# Patient Record
Sex: Female | Born: 1970 | Race: White | Hispanic: No | Marital: Married | Smoking: Never smoker
Health system: Southern US, Community
[De-identification: ages and names within clinical notes are randomized; demographics above are authoritative.]

---

## 1997-09-22 ENCOUNTER — Inpatient Hospital Stay (HOSPITAL_COMMUNITY): Admission: AD | Admit: 1997-09-22 | Discharge: 1997-09-24 | Payer: Self-pay | Admitting: *Deleted

## 1997-10-29 ENCOUNTER — Other Ambulatory Visit: Admission: RE | Admit: 1997-10-29 | Discharge: 1997-10-29 | Payer: Self-pay | Admitting: *Deleted

## 1998-10-20 ENCOUNTER — Other Ambulatory Visit: Admission: RE | Admit: 1998-10-20 | Discharge: 1998-10-20 | Payer: Self-pay | Admitting: *Deleted

## 1999-07-11 ENCOUNTER — Inpatient Hospital Stay (HOSPITAL_COMMUNITY): Admission: AD | Admit: 1999-07-11 | Discharge: 1999-07-13 | Payer: Self-pay | Admitting: Obstetrics and Gynecology

## 1999-07-11 ENCOUNTER — Encounter (INDEPENDENT_AMBULATORY_CARE_PROVIDER_SITE_OTHER): Payer: Self-pay

## 1999-08-13 ENCOUNTER — Other Ambulatory Visit: Admission: RE | Admit: 1999-08-13 | Discharge: 1999-08-13 | Payer: Self-pay | Admitting: Obstetrics and Gynecology

## 2000-08-19 ENCOUNTER — Other Ambulatory Visit: Admission: RE | Admit: 2000-08-19 | Discharge: 2000-08-19 | Payer: Self-pay | Admitting: Obstetrics and Gynecology

## 2006-08-30 ENCOUNTER — Ambulatory Visit: Payer: Self-pay | Admitting: Gastroenterology

## 2006-08-30 LAB — CONVERTED CEMR LAB
ALT: 35 units/L (ref 0–35)
AST: 29 units/L (ref 0–37)
Albumin: 3.7 g/dL (ref 3.5–5.2)
Alkaline Phosphatase: 43 units/L (ref 39–117)
BUN: 20 mg/dL (ref 6–23)
Basophils Absolute: 0 10*3/uL (ref 0.0–0.1)
Basophils Relative: 0.3 % (ref 0.0–1.0)
Bilirubin, Direct: 0.1 mg/dL (ref 0.0–0.3)
CO2: 34 meq/L — ABNORMAL HIGH (ref 19–32)
Calcium: 9.2 mg/dL (ref 8.4–10.5)
Chloride: 108 meq/L (ref 96–112)
Creatinine, Ser: 0.6 mg/dL (ref 0.4–1.2)
Eosinophils Absolute: 0.2 10*3/uL (ref 0.0–0.6)
Eosinophils Relative: 4.4 % (ref 0.0–5.0)
GFR calc Af Amer: 145 mL/min
GFR calc non Af Amer: 120 mL/min
Glucose, Bld: 93 mg/dL (ref 70–99)
HCT: 38.5 % (ref 36.0–46.0)
Hemoglobin: 13.6 g/dL (ref 12.0–15.0)
Lymphocytes Relative: 24.3 % (ref 12.0–46.0)
MCHC: 35.2 g/dL (ref 30.0–36.0)
MCV: 93.5 fL (ref 78.0–100.0)
Monocytes Absolute: 0.6 10*3/uL (ref 0.2–0.7)
Monocytes Relative: 11.1 % — ABNORMAL HIGH (ref 3.0–11.0)
Neutro Abs: 3.1 10*3/uL (ref 1.4–7.7)
Neutrophils Relative %: 59.9 % (ref 43.0–77.0)
Platelets: 242 10*3/uL (ref 150–400)
Potassium: 4.5 meq/L (ref 3.5–5.1)
RBC: 4.12 M/uL (ref 3.87–5.11)
RDW: 12 % (ref 11.5–14.6)
Sodium: 146 meq/L — ABNORMAL HIGH (ref 135–145)
TSH: 1.32 microintl units/mL (ref 0.35–5.50)
Total Bilirubin: 0.7 mg/dL (ref 0.3–1.2)
Total Protein: 6 g/dL (ref 6.0–8.3)
WBC: 5.1 10*3/uL (ref 4.5–10.5)

## 2006-09-02 ENCOUNTER — Emergency Department (HOSPITAL_COMMUNITY): Admission: EM | Admit: 2006-09-02 | Discharge: 2006-09-03 | Payer: Self-pay | Admitting: Emergency Medicine

## 2006-09-09 ENCOUNTER — Ambulatory Visit: Payer: Self-pay | Admitting: Gastroenterology

## 2006-09-09 DIAGNOSIS — K6389 Other specified diseases of intestine: Secondary | ICD-10-CM | POA: Insufficient documentation

## 2007-04-22 DIAGNOSIS — K625 Hemorrhage of anus and rectum: Secondary | ICD-10-CM | POA: Insufficient documentation

## 2007-04-22 DIAGNOSIS — R1031 Right lower quadrant pain: Secondary | ICD-10-CM | POA: Insufficient documentation

## 2009-03-16 IMAGING — US US TRANSVAGINAL NON-OB
1 series · 14 of 25 positions shown · non-contrast
Comparison: none

CLINICAL DATA: None given. 
 ABDOMEN ULTRASOUND:
TECHNIQUE: Complete abdominal ultrasound examination was performed including evaluation of the liver, gallbladder, bile ducts, pancreas, kidneys, spleen, IVC, and abdominal aorta.
TECHNIQUE: Both transabdominal and transvaginal ultrasound examinations of the pelvis were performed including evaluation of the uterus, ovaries, adnexal regions, and pelvic cul-de-sac.

[Series 1: unknown · 0.32mm/px · 14 of 35 slices shown]
[im 1/35]
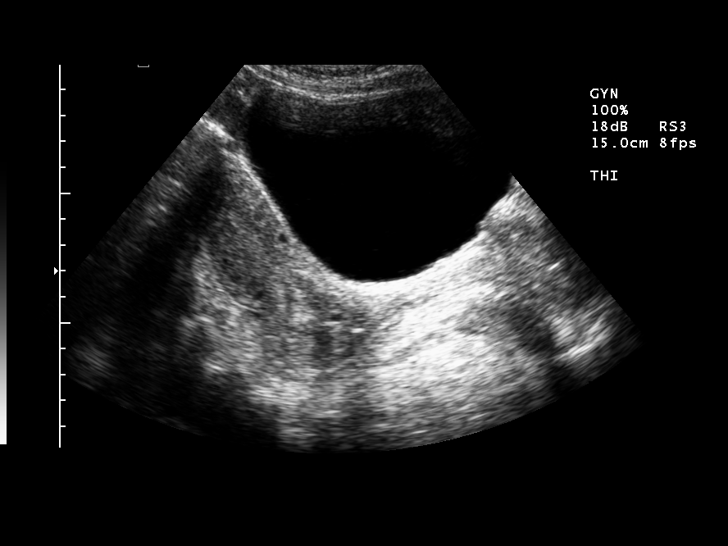
[im 3/35]
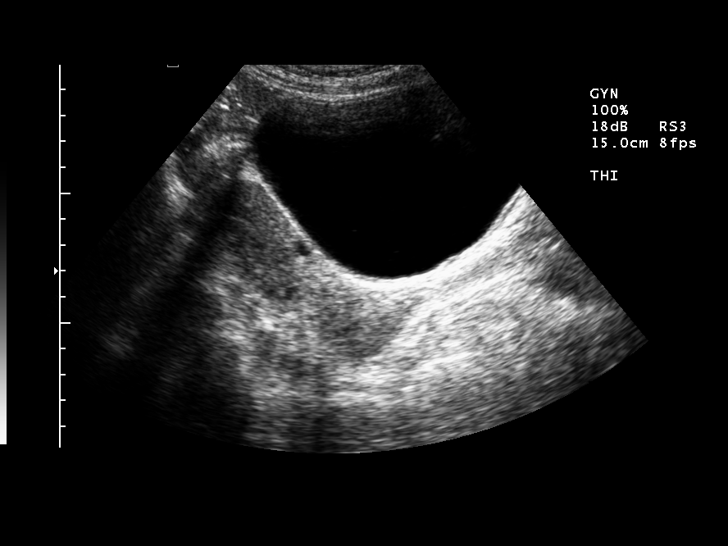
[im 6/35]
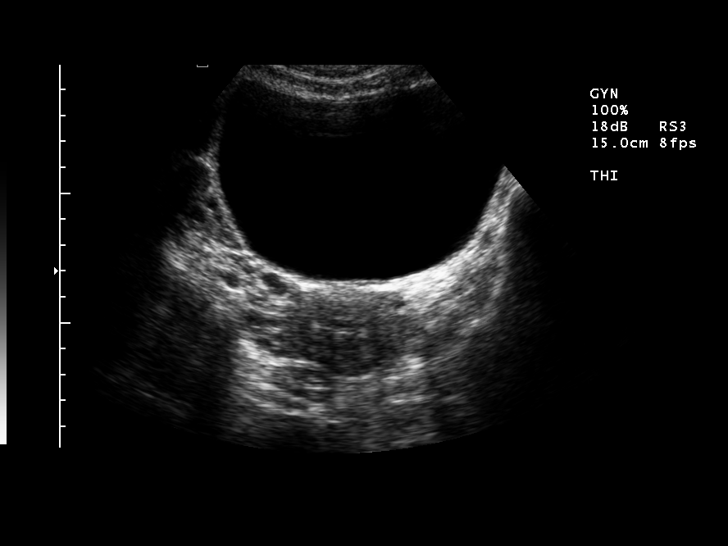
[im 9/35]
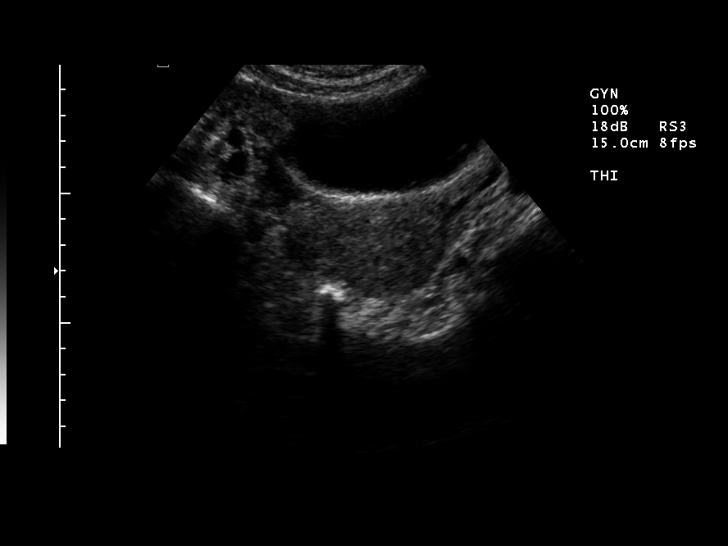
[im 12/35]
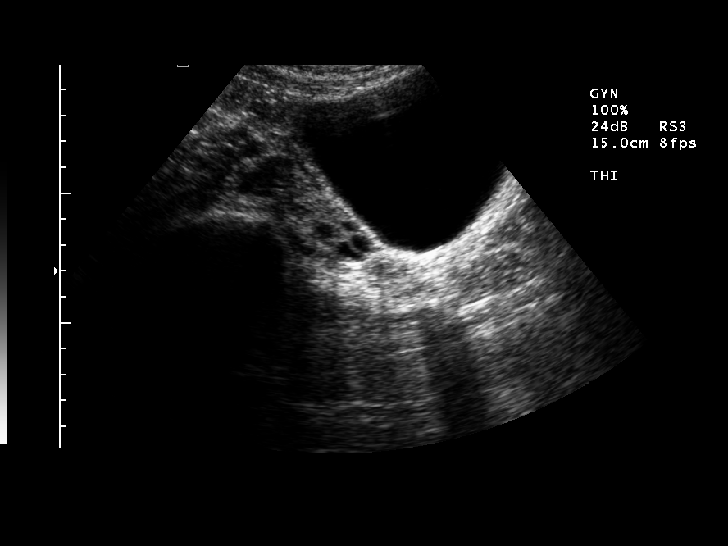
[im 13/35]
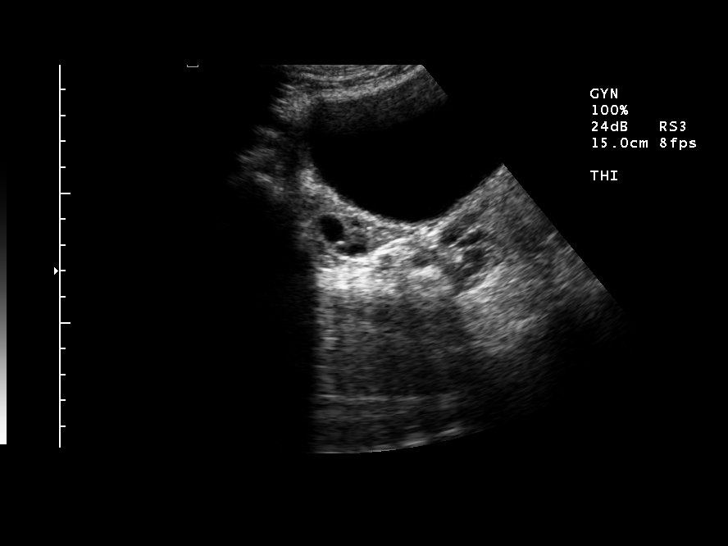
[im 16/35]
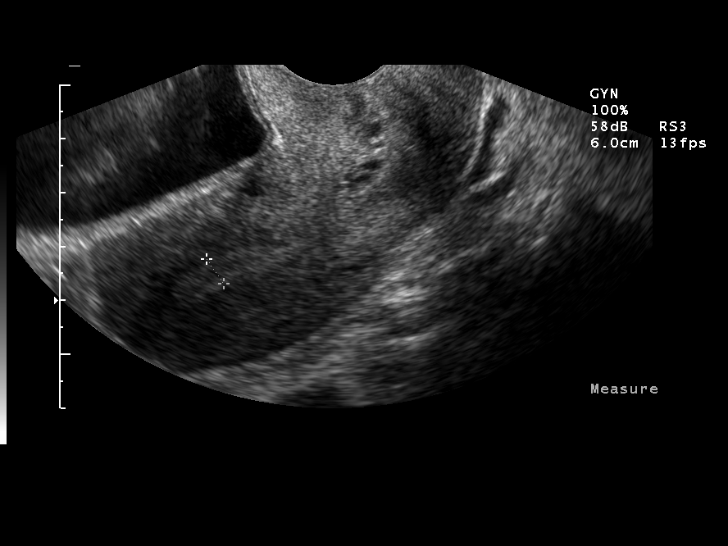
[im 19/35]
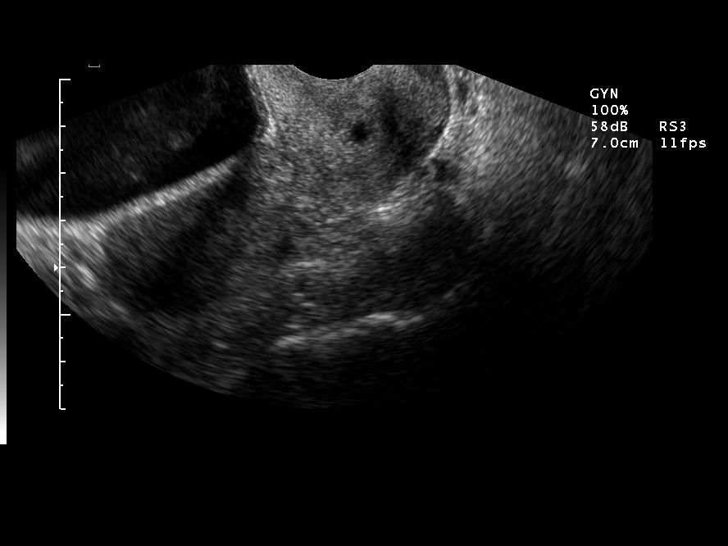
[im 22/35]
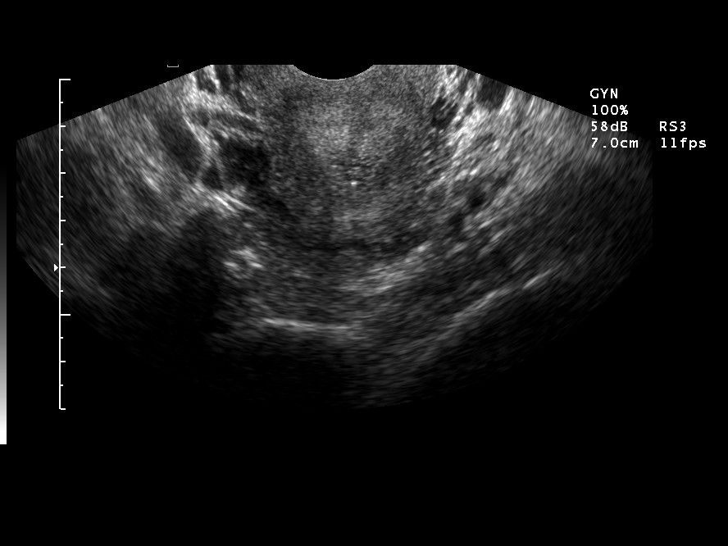
[im 23/35]
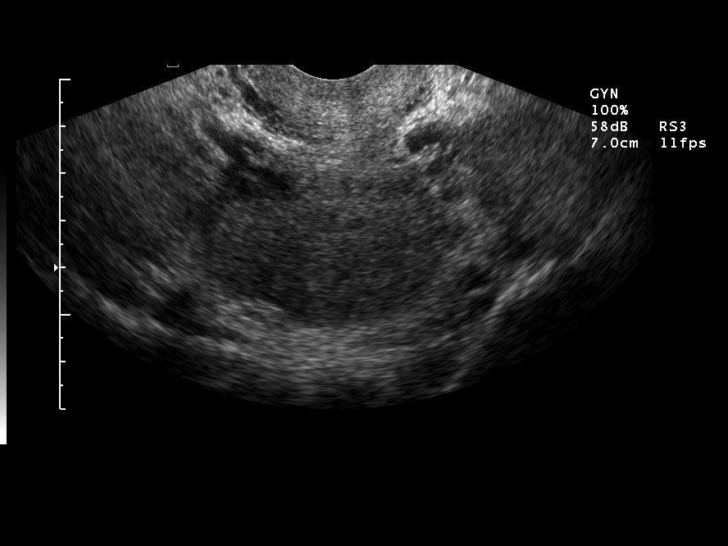
[im 26/35]
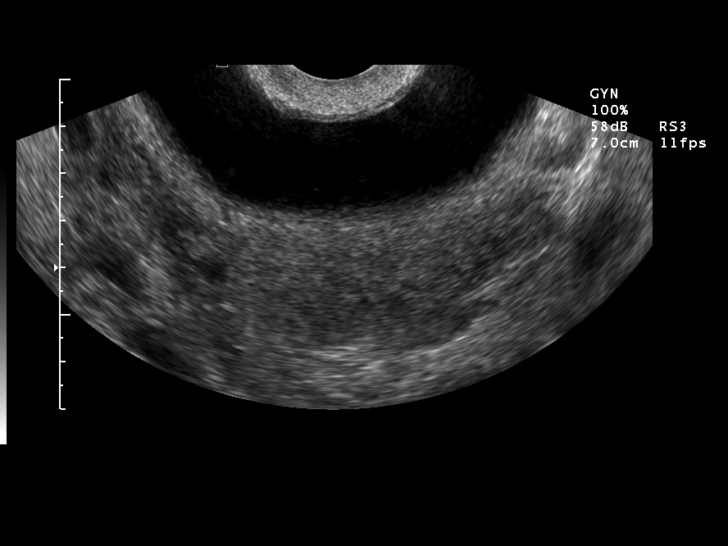
[im 29/35]
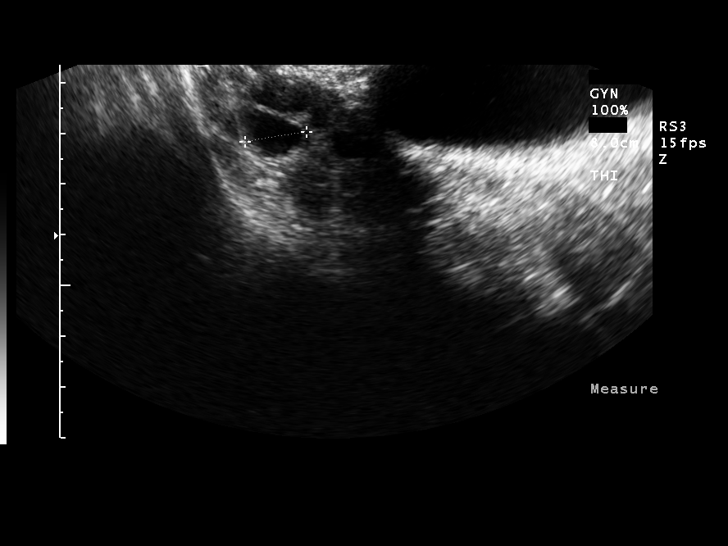
[im 32/35]
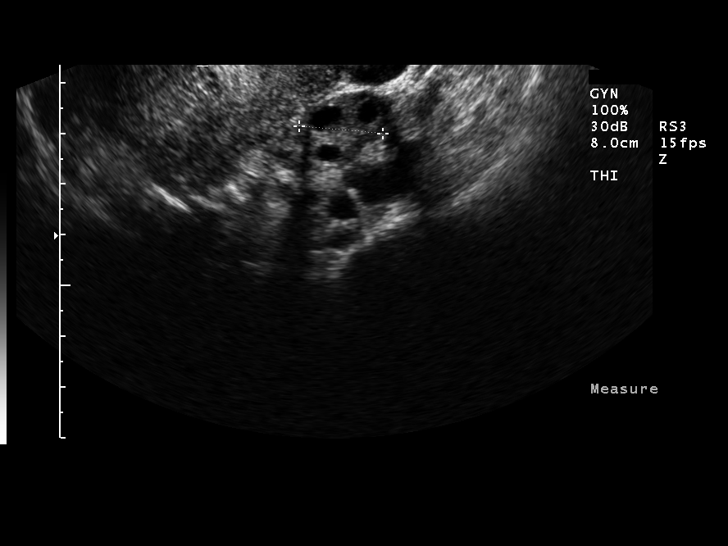
[im 35/35]
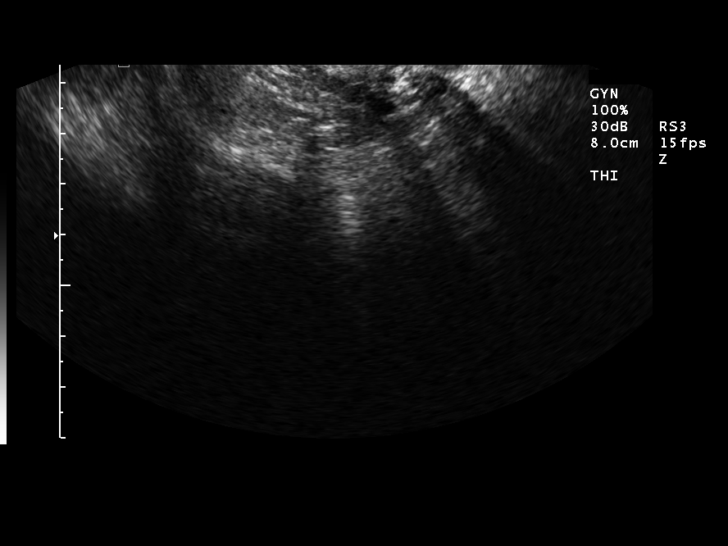

[14 of 25 positions shown; findings below may reference images not displayed]

FINDINGS: There is no evidence of gallstones or biliary ductal dilatation.  The liver is within normal limits in echogenicity, and no focal liver lesions are seen.  The visualized portions of the IVC and pancreas are unremarkable.
 There is no evidence of splenomegaly.  The kidneys are unremarkable, and there is no evidence of hydronephrosis.  The abdominal aorta is non-dilated.
IMPRESSION: Negative abdominal ultrasound.
 TRANSABDOMINAL AND TRANSVAGINAL PELVIC ULTRASOUND:
FINDINGS: The uterus measures 8.7 x 4.0 x 5.3 cm with an endometrial stripe thickness of 5.6 mm. The right ovary measures 3.7 x 2.2 x 1.2 cm and the left ovary measures 3.1 x 1.6 x 1.7 cm. Both ovaries appear normal. No free pelvic fluid.
IMPRESSION: Negative exam.

## 2010-02-17 NOTE — Procedures (Signed)
Summary: Gastroenterology colon  Gastroenterology colon   Imported By: Donneta Romberg 04/22/2007 14:03:46  _____________________________________________________________________  External Attachment:    Type:   Image     Comment:   External Document

## 2010-06-02 NOTE — Assessment & Plan Note (Signed)
[facility]                         GASTROENTEROLOGY OFFICE NOTE   Angela Burns, Angela Burns                           MRN:          [id number]  DATE:08/30/2006                            DOB:          1970/08/19    REFERRING PHYSICIAN:  Quita Skye. Artis Flock, M.D.   REASON FOR REFERRAL:  Dr. Artis Flock asked me to evaluate Angela Burns in  consultation regarding right lower quadrant discomfort and intermittent  rectal bleeding.   HPI:  Angela Burns is a very pleasant 39 year old woman who has had  approximately 6-8 months of right lower quadrant discomfort.  She  describes it as a slight burning sensation, it is low in her right  quadrant.  She feels as if something is trying to come out.  She says  moving her bowels does seem to help a bit.  She thinks eating does not  make it better or worse.  She had two colonics which did seem to help as  well.  She admits she does have to strain at times to move her bowels,  but she usually will go every day.  Sometimes she has quite hard stools.  She has seen intermittent rectal bleeding, although very minor in  nature.  She attributed this to hemorrhoids in the past.  She does have  somewhat of a poor appetite and her husband feels she has lost some  weight recently.  She does not think she has lost weight.  Possibly of  important note, these symptoms started around the same time she started  a new job with [employer].  She admits her life is fairly stressful since  starting this job and trying to raise 2 kids and she is married also.   REVIEW OF SYSTEMS:  Is notable for possible weight loss, poor appetite.  The rest of her review of systems essentially normal and is available on  her nursing intake sheet.   PAST MEDICAL HISTORY:  None.   CURRENT MEDICINES:  None.   ALLERGIES:  No known drug allergies.   SOCIAL HISTORY:  1. Married with 2 children ages 5 and 12.  2. Lives with her husband and kids.  3. Works at [employer].  4.  Nonsmoker.  5. Rarely drinks red wine, maybe once a week.   FAMILY HISTORY:  No colon cancer or colon polyps in the family.   PHYSICAL EXAM:  Is 5 feet 5 inches, 123 pounds, blood pressure 108/58,  pulse 58.  CONSTITUTIONAL:  Generally well appearing.  NEUROLOGIC:  Alert and oriented x3.  EYES:  Extraocular movements intact.  MOUTH:  Oropharynx moist, no lesions.  NECK:  Supple, no lymphadenopathy.  CARDIOVASCULAR:  Heart regular rate and rhythm.  LUNGS:  Clear to auscultation bilaterally.  ABDOMEN:  Soft, nontender, nondistended, normal bowel sounds.  EXTREMITIES:  No lower extremity edema.  SKIN:  No rashes or lesions on visible extremities.   ASSESSMENT AND PLAN:  A 40 year old woman with right lower quadrant  discomforts that seem to be improved with moving her bowels,  intermittent bright red blood per rectum.   First, she does  not appear to be anemic but I will get a basic set of  labs to see if that is indeed the case.  Labs will include a CBC, a  complete metabolic profile and thyroid testing.  I think we should  proceed with full colonoscopy since she has seen blood in her stool,  although I doubt there is anything very significant given her overall  clinical appearance.  Recommend that she try Citrucel as a fiber  supplement to help try to regulate her bowels to see if we can keep her  from straining.  That may improve her right lower quadrant discomfort as  well.     Rachael Fee, MD  Electronically Signed    DPJ/MedQ  DD: 08/30/2006  DT: 08/31/2006  Job #: [id number]   cc:   Quita Skye. Artis Flock, M.D.

## 2010-10-30 LAB — DIFFERENTIAL
Basophils Absolute: 0
Basophils Relative: 0
Eosinophils Absolute: 0.2
Eosinophils Relative: 4
Lymphocytes Relative: 31
Lymphs Abs: 1.7
Monocytes Absolute: 0.6
Monocytes Relative: 11
Neutro Abs: 2.9
Neutrophils Relative %: 55

## 2010-10-30 LAB — BASIC METABOLIC PANEL
BUN: 19
CO2: 30
Calcium: 9.1
Chloride: 96
Creatinine, Ser: 0.8
GFR calc Af Amer: >60
GFR calc non Af Amer: >60
Glucose, Bld: 90
Potassium: 4.3
Sodium: 132 — ABNORMAL LOW

## 2010-10-30 LAB — CBC
HCT: 41.5
Hemoglobin: 14.4
MCHC: 34.7
MCV: 93.6
Platelets: 264
RBC: 4.43
RDW: 13.2
WBC: 5.4

## 2010-10-30 LAB — URINALYSIS, ROUTINE W REFLEX MICROSCOPIC
Bilirubin Urine: NEGATIVE
Glucose, UA: NEGATIVE
Hgb urine dipstick: NEGATIVE
Ketones, ur: NEGATIVE
Nitrite: NEGATIVE
Protein, ur: NEGATIVE
Specific Gravity, Urine: 1.013
Urobilinogen, UA: 0.2
pH: 6.5

## 2010-10-30 LAB — URINE MICROSCOPIC-ADD ON

## 2010-10-30 LAB — LIPASE, BLOOD: Lipase: 19

## 2010-10-30 LAB — PREGNANCY, URINE: Preg Test, Ur: NEGATIVE

## 2014-01-31 ENCOUNTER — Encounter: Payer: Self-pay | Admitting: Gastroenterology

## 2019-03-25 ENCOUNTER — Ambulatory Visit: Payer: BC Managed Care – PPO | Attending: Internal Medicine

## 2019-03-25 DIAGNOSIS — Z23 Encounter for immunization: Secondary | ICD-10-CM | POA: Insufficient documentation

## 2019-03-25 NOTE — Progress Notes (Signed)
   Covid-19 Vaccination Clinic  Name:  Ciel Chervenak [facility]    MRN: [id number] DOB: 11/19/1970  03/25/2019  Ms. Vanhorne was observed post Covid-19 immunization for 15 minutes without incident. She was provided with Vaccine Information Sheet and instruction to access the V-Safe system.   Ms. Pletz was instructed to call 911 with any severe reactions post vaccine: Marland Kitchen Difficulty breathing  . Swelling of face and throat  . A fast heartbeat  . A bad rash all over body  . Dizziness and weakness   Immunizations Administered    Name Date Dose VIS Date Route   Pfizer COVID-19 Vaccine 03/25/2019  5:50 PM 0.3 mL 12/29/2018 Intramuscular   Manufacturer: [employer], [employer]   Lot: [id number]   NDC: [address]-4

## 2019-04-24 ENCOUNTER — Ambulatory Visit: Payer: BC Managed Care – PPO | Attending: Internal Medicine

## 2019-04-24 DIAGNOSIS — Z23 Encounter for immunization: Secondary | ICD-10-CM

## 2019-04-24 NOTE — Progress Notes (Signed)
   Covid-19 Vaccination Clinic  Name:  Angela Burns [facility]    MRN: [id number] DOB: 1970/08/03  04/24/2019  Angela Burns was observed post Covid-19 immunization for 15 minutes without incident. She was provided with Vaccine Information Sheet and instruction to access the V-Safe system.   Angela Burns was instructed to call 911 with any severe reactions post vaccine: Marland Kitchen Difficulty breathing  . Swelling of face and throat  . A fast heartbeat  . A bad rash all over body  . Dizziness and weakness   Immunizations Administered    Name Date Dose VIS Date Route   Pfizer COVID-19 Vaccine 04/24/2019  1:02 PM 0.3 mL 12/29/2018 Intramuscular   Manufacturer: [employer], [employer]   Lot: [id number]   NDC: [address]-7

## 2020-04-17 ENCOUNTER — Emergency Department (HOSPITAL_COMMUNITY)
Admission: EM | Admit: 2020-04-17 | Discharge: 2020-04-17 | Disposition: A | Discharge status: Home / Self Care | Payer: BC Managed Care – PPO | Attending: Emergency Medicine | Admitting: Emergency Medicine

## 2020-04-17 ENCOUNTER — Encounter (HOSPITAL_COMMUNITY): Payer: Self-pay

## 2020-04-17 ENCOUNTER — Emergency Department (HOSPITAL_COMMUNITY): Payer: BC Managed Care – PPO

## 2020-04-17 ENCOUNTER — Other Ambulatory Visit: Payer: Self-pay

## 2020-04-17 DIAGNOSIS — R609 Edema, unspecified: Secondary | ICD-10-CM

## 2020-04-17 DIAGNOSIS — R600 Localized edema: Secondary | ICD-10-CM | POA: Insufficient documentation

## 2020-04-17 DIAGNOSIS — R7989 Other specified abnormal findings of blood chemistry: Secondary | ICD-10-CM | POA: Diagnosis not present

## 2020-04-17 LAB — CBC WITH DIFFERENTIAL/PLATELET
Abs Immature Granulocytes: 0.01 10*3/uL (ref 0.00–0.07)
Basophils Absolute: 0 10*3/uL (ref 0.0–0.1)
Basophils Relative: 1 %
Eosinophils Absolute: 0 10*3/uL (ref 0.0–0.5)
Eosinophils Relative: 1 %
HCT: 41.1 % (ref 36.0–46.0)
Hemoglobin: 13.1 g/dL (ref 12.0–15.0)
Immature Granulocytes: 0 %
Lymphocytes Relative: 18 %
Lymphs Abs: 0.9 10*3/uL (ref 0.7–4.0)
MCH: 31.4 pg (ref 26.0–34.0)
MCHC: 31.9 g/dL (ref 30.0–36.0)
MCV: 98.6 fL (ref 80.0–100.0)
Monocytes Absolute: 0.7 10*3/uL (ref 0.1–1.0)
Monocytes Relative: 15 %
Neutro Abs: 3 10*3/uL (ref 1.7–7.7)
Neutrophils Relative %: 65 %
Platelets: 322 10*3/uL (ref 150–400)
RBC: 4.17 MIL/uL (ref 3.87–5.11)
RDW: 13.6 % (ref 11.5–15.5)
WBC: 4.7 10*3/uL (ref 4.0–10.5)
nRBC: 0 % (ref 0.0–0.2)

## 2020-04-17 LAB — COMPREHENSIVE METABOLIC PANEL
ALT: 33 U/L (ref 0–44)
AST: 44 U/L — ABNORMAL HIGH (ref 15–41)
Albumin: 2.9 g/dL — ABNORMAL LOW (ref 3.5–5.0)
Alkaline Phosphatase: 46 U/L (ref 38–126)
Anion gap: 9 (ref 5–15)
BUN: 31 mg/dL — ABNORMAL HIGH (ref 6–20)
CO2: 30 mmol/L (ref 22–32)
Calcium: 9.3 mg/dL (ref 8.9–10.3)
Chloride: 100 mmol/L (ref 98–111)
Creatinine, Ser: 1.27 mg/dL — ABNORMAL HIGH (ref 0.44–1.00)
GFR, Estimated: 51 mL/min — ABNORMAL LOW (ref >60)
Glucose, Bld: 95 mg/dL (ref 70–99)
Potassium: 3.1 mmol/L — ABNORMAL LOW (ref 3.5–5.1)
Sodium: 139 mmol/L (ref 135–145)
Total Bilirubin: 0.5 mg/dL (ref 0.3–1.2)
Total Protein: 5.8 g/dL — ABNORMAL LOW (ref 6.5–8.1)

## 2020-04-17 LAB — BRAIN NATRIURETIC PEPTIDE: B Natriuretic Peptide: 31.1 pg/mL (ref 0.0–100.0)

## 2020-04-17 LAB — I-STAT BETA HCG BLOOD, ED (MC, WL, AP ONLY): I-stat hCG, quantitative: 6.9 m[IU]/mL — ABNORMAL HIGH (ref <5)

## 2020-04-17 MED ORDER — POTASSIUM CHLORIDE ER 10 MEQ PO TBCR: 10.0000 meq | EXTENDED_RELEASE_TABLET | Freq: Every day | ORAL | 0 refills | Status: AC

## 2020-04-17 MED ORDER — POTASSIUM CHLORIDE CRYS ER 20 MEQ PO TBCR
20.0000 meq | EXTENDED_RELEASE_TABLET | Freq: Once | ORAL | Status: AC
  Administered 2020-04-17: 20 meq via ORAL
  Filled 2020-04-17: qty 1

## 2020-04-17 MED ORDER — FUROSEMIDE 10 MG/ML IJ SOLN
10.0000 mg | Freq: Once | INTRAMUSCULAR | Status: AC
  Administered 2020-04-17: 10 mg via INTRAVENOUS
  Filled 2020-04-17: qty 4

## 2020-04-17 NOTE — Discharge Instructions (Signed)
Take potassium daily. Increase your water intake and decrease your salt intake. Wear compression socks and keep your legs elevated when able. I have ordered an ultrasound of both lower legs for tomorrow to make sure there is no blood clot.  While my suspicion is low, in the setting of recent travel and now leg swelling, this would be the way to rule out a blood clot. Your kidney function today was slightly worse than it has been in the past.  This is likely due to dehydration and should improve with fluid intake.  However it is important that you get this rechecked with your primary care doctor. The best way to establish with a primary care doctor is to call the number listed on the back of your insurance card. Return to emergency room with any new, worsening, concerning symptoms

## 2020-04-17 NOTE — ED Provider Notes (Signed)
[facility]-EMERGENCY DEPT Provider Note   CSN: [id number] Arrival date & time: 04/17/20  1901     History Chief Complaint  Patient presents with  . Leg Swelling    Angela Burns is a 50 y.o. female presenting for evaluation of leg swelling.  Patient states for the past 4 days she has had bilateral leg swelling.  She reports due to swelling, she is having pain.  This began after she flew back and forth from [place].  She denies history of leg swelling at this before.  She states leg swelling is improved when she first wakes up the morning, worsens throughout the day.  She has tried over-the-counter fluid pills without improvement.  She has not tried anything else.  She denies fevers, chills, chest pain, shortness breath, cough, dyspnea on exertion, orthopnea, nausea, vomiting, abdominal pain or swelling, urinary symptoms, normal bowel movements.  She denies swelling of her upper extremities.  She has no known medical problems and takes no medications daily.  She does not have a PCP.  She reports increased salt intake and that she has been going out to eat a lot recently due to recent travel, no other change in diet.   HPI     History reviewed. No pertinent past medical history.  Patient Active Problem List   Diagnosis Date Noted  . RECTAL BLEEDING 04/22/2007  . ABDOMINAL PAIN RIGHT LOWER QUADRANT 04/22/2007  . 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 No pertinent surgical history.   OB History   No obstetric history on file.     No family history on file.  Social History   Tobacco Use  . Smoking status: Never Smoker  . Smokeless tobacco: Never Used    Home Medications Prior to Admission medications   Medication Sig Start Date End Date Taking? Authorizing Provider  potassium chloride (KLOR-CON) 10 MEQ tablet Take 1 tablet (10 mEq total) by mouth daily. 04/17/20  Yes Jadakiss Barish, PA-C    Allergies    Patient has no known  allergies.  Review of Systems   Review of Systems  Cardiovascular: Positive for leg swelling.  All other systems reviewed and are negative.   Physical Exam Updated Vital Signs BP 105/65   Pulse 65   Temp 97.7 F (36.5 C) (Oral)   Resp 19   Ht 5\' 5"  (1.651 m)   Wt 53.5 kg   SpO2 97%   BMI 19.64 kg/m   Physical Exam Vitals and nursing note reviewed.  Constitutional:      General: She is not in acute distress.    Appearance: She is well-developed.     Comments: Resting in the bed in NAD  HENT:     Head: Normocephalic and atraumatic.  Eyes:     Conjunctiva/sclera: Conjunctivae normal.     Pupils: Pupils are equal, round, and reactive to light.  Cardiovascular:     Rate and Rhythm: Normal rate and regular rhythm.     Pulses: Normal pulses.  Pulmonary:     Effort: Pulmonary effort is normal. No respiratory distress.     Breath sounds: Normal breath sounds. No wheezing.     Comments: Clear lung sounds in all fields Abdominal:     General: There is no distension.     Palpations: Abdomen is soft. There is no mass.     Tenderness: There is no abdominal tenderness. There is no guarding or rebound.  Musculoskeletal:        General:  Normal range of motion.     Cervical back: Normal range of motion and neck supple.     Right lower leg: Edema present.     Left lower leg: Edema present.     Comments: 1+ pitting edema lower lower ext. Equal bilaterally. Pedal pulses 2+. No ttp of the calves. No erythema or warmth  Skin:    General: Skin is warm and dry.     Capillary Refill: Capillary refill takes less than 2 seconds.  Neurological:     Mental Status: She is alert and oriented to person, place, and time.     ED Results / Procedures / Treatments   Labs (all labs ordered are listed, but only abnormal results are displayed) Labs Reviewed  COMPREHENSIVE METABOLIC PANEL - Abnormal; Notable for the following components:      Result Value   Potassium 3.1 (*)    BUN 31 (*)     Creatinine, Ser 1.27 (*)    Total Protein 5.8 (*)    Albumin 2.9 (*)    AST 44 (*)    GFR, Estimated 51 (*)    All other components within normal limits  I-STAT BETA HCG BLOOD, ED ([facility], [facility], [facility] ONLY) - Abnormal; Notable for the following components:   I-stat hCG, quantitative 6.9 (*)    All other components within normal limits  CBC WITH DIFFERENTIAL/PLATELET  BRAIN NATRIURETIC PEPTIDE    EKG None  Radiology DG Chest 2 View  Result Date: 04/17/2020 CLINICAL DATA:  Edema. EXAM: CHEST - 2 VIEW COMPARISON:  None. FINDINGS: The heart size and mediastinal contours are within normal limits. Both lungs are clear. The visualized skeletal structures are unremarkable. IMPRESSION: No active cardiopulmonary disease. Electronically Signed   By: Ted Mcalpine M.D.   On: 04/17/2020 20:54    Procedures Procedures   Medications Ordered in ED Medications  furosemide (LASIX) injection 10 mg (has no administration in time range)  potassium chloride SA (KLOR-CON) CR tablet 20 mEq (has no administration in time range)    ED Course  I have reviewed the triage vital signs and the nursing notes.  Pertinent labs & imaging results that were available during my care of the patient were reviewed by me and considered in my medical decision making (see chart for details).    MDM Rules/Calculators/A&P                          Patient presented for evaluation of bilateral leg swelling.  On exam, patient appears nontoxic.  She is not reporting any respiratory symptoms, as such, doubt CHF.  However in the setting of new leg swelling, will obtain labs including BNP and chest x-ray.  EKG ordered.  Exam is not consistent with infection.  Less likely DVT as patient does not have calf pain, however symptoms began after travel.  Ultrasound is not available at this time, will plan for outpatient study if work-up is negative.  Labs interpreted by me, overall reassuring.  Mild elevation in creatinine from previous  in the EMR, that was 13 years ago.  Patient appears slightly dehydrated, albumin is slightly low which may also be contributing to peripheral edema.  Discussed findings with patient.  Discussed that she should stop taking the caffeine pill as this may be causing dehydration.  Discussed importance of increase water intake and decrease salt intake.  Additionally, patient mildly hypokalemic, will replenish orally.  Discussed with patient symptomatic management including elevation, compression socks.  Patient given a small dose of Lasix in the ED to see if this helps.  Encouraged close follow-up with PCP.  At this time, patient appears safe for discharge.  Return precautions given.  Patient states she understands and agrees to plan.  Final Clinical Impression(s) / ED Diagnoses Final diagnoses:  Peripheral edema  Elevated serum creatinine    Rx / DC Orders ED Discharge Orders         Ordered    potassium chloride (KLOR-CON) 10 MEQ tablet  Daily        04/17/20 2148    LE VENOUS        04/17/20 2149           Alveria Apley, PA-C 04/17/20 2211    Terrilee Files, MD 04/18/20 1030

## 2020-04-17 NOTE — ED Triage Notes (Signed)
Pt reports bilateral leg swelling for roughly 4 days.

## 2020-04-18 ENCOUNTER — Ambulatory Visit (HOSPITAL_COMMUNITY)
Admission: RE | Admit: 2020-04-18 | Discharge: 2020-04-18 | Disposition: A | Discharge status: Home / Self Care | Payer: BC Managed Care – PPO | Source: Ambulatory Visit | Attending: Student | Admitting: Student

## 2020-04-18 DIAGNOSIS — M7989 Other specified soft tissue disorders: Secondary | ICD-10-CM | POA: Diagnosis present

## 2020-04-18 NOTE — Progress Notes (Signed)
Bilateral lower extremity venous study completed.      Please see CV Proc for preliminary results.   Khaza Blansett, RVT  

## 2022-10-29 IMAGING — CR DG CHEST 2V
2 series · 2 of 2 positions shown · non-contrast
Comparison: None.

CLINICAL DATA: Edema.

EXAM:
CHEST - 2 VIEW

[w chest pa]
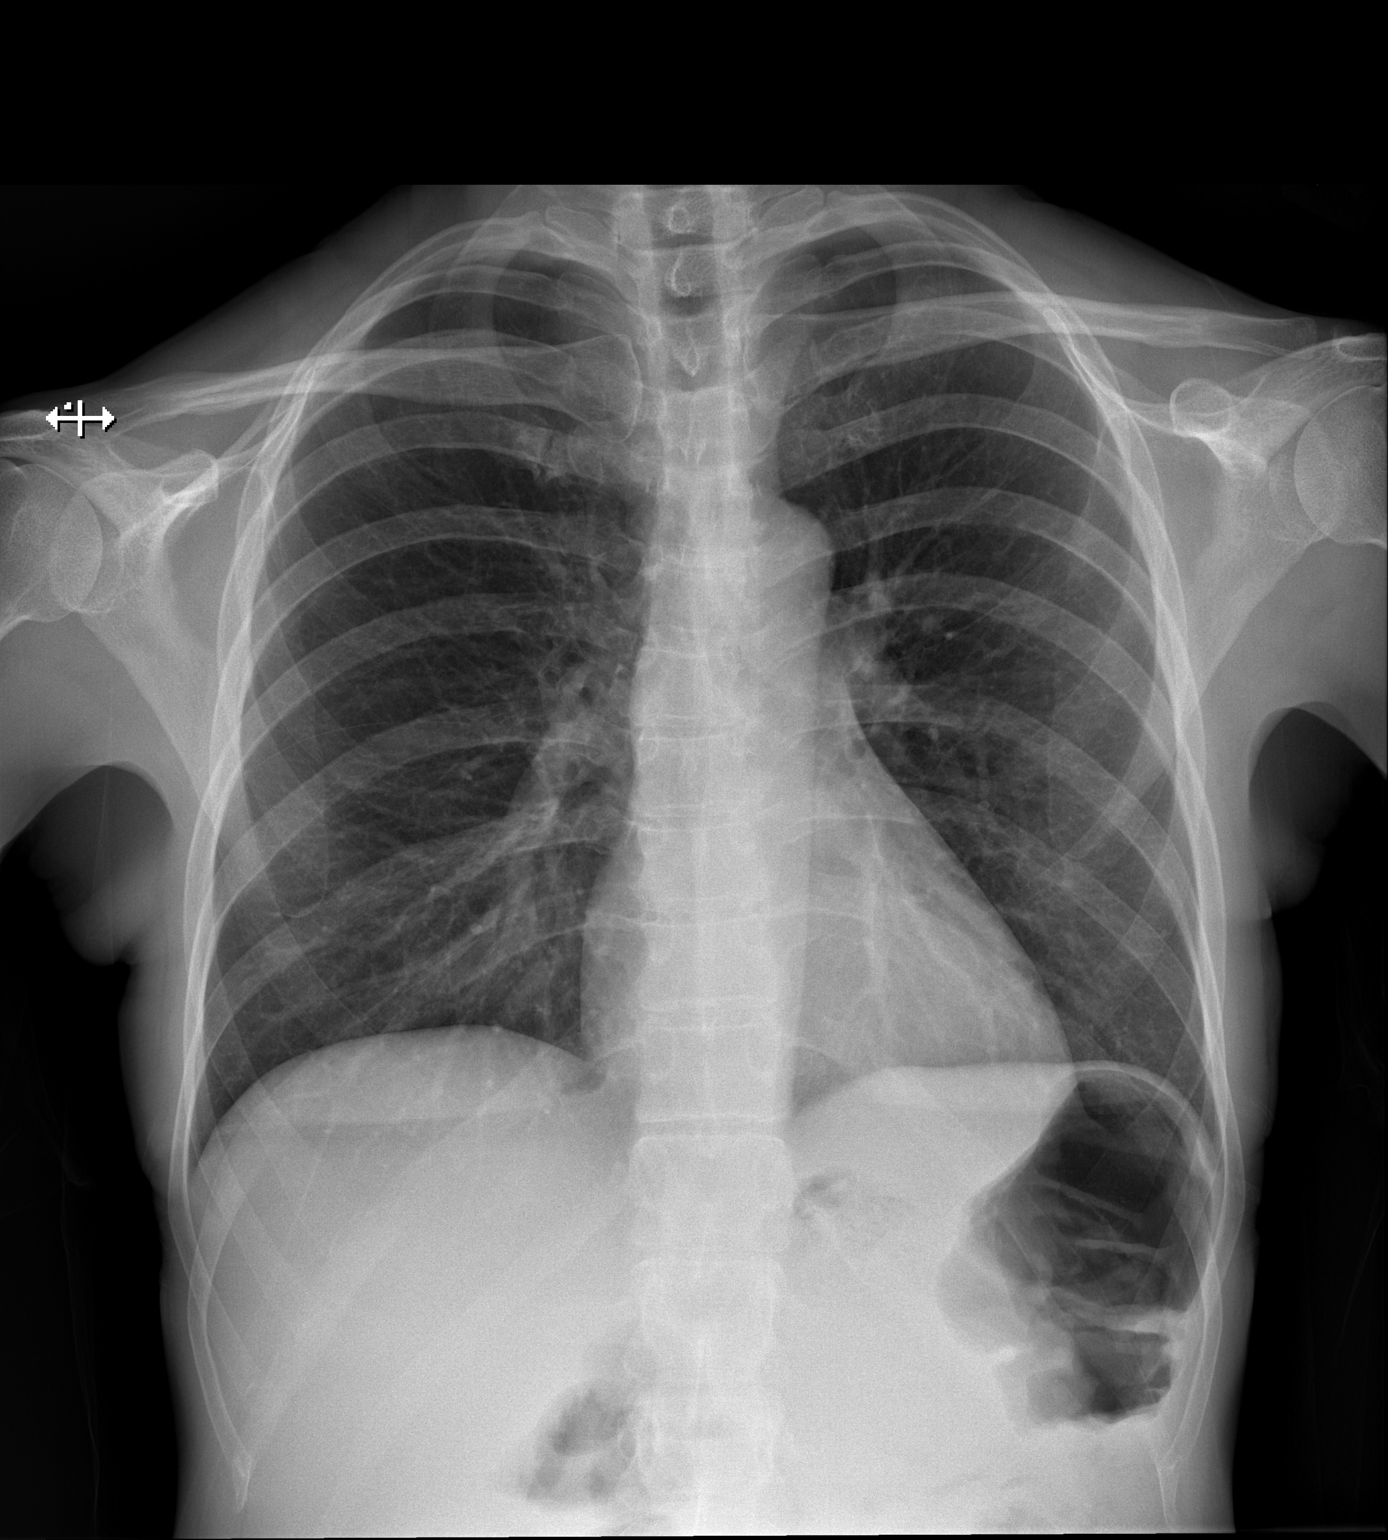

[w chest lat]
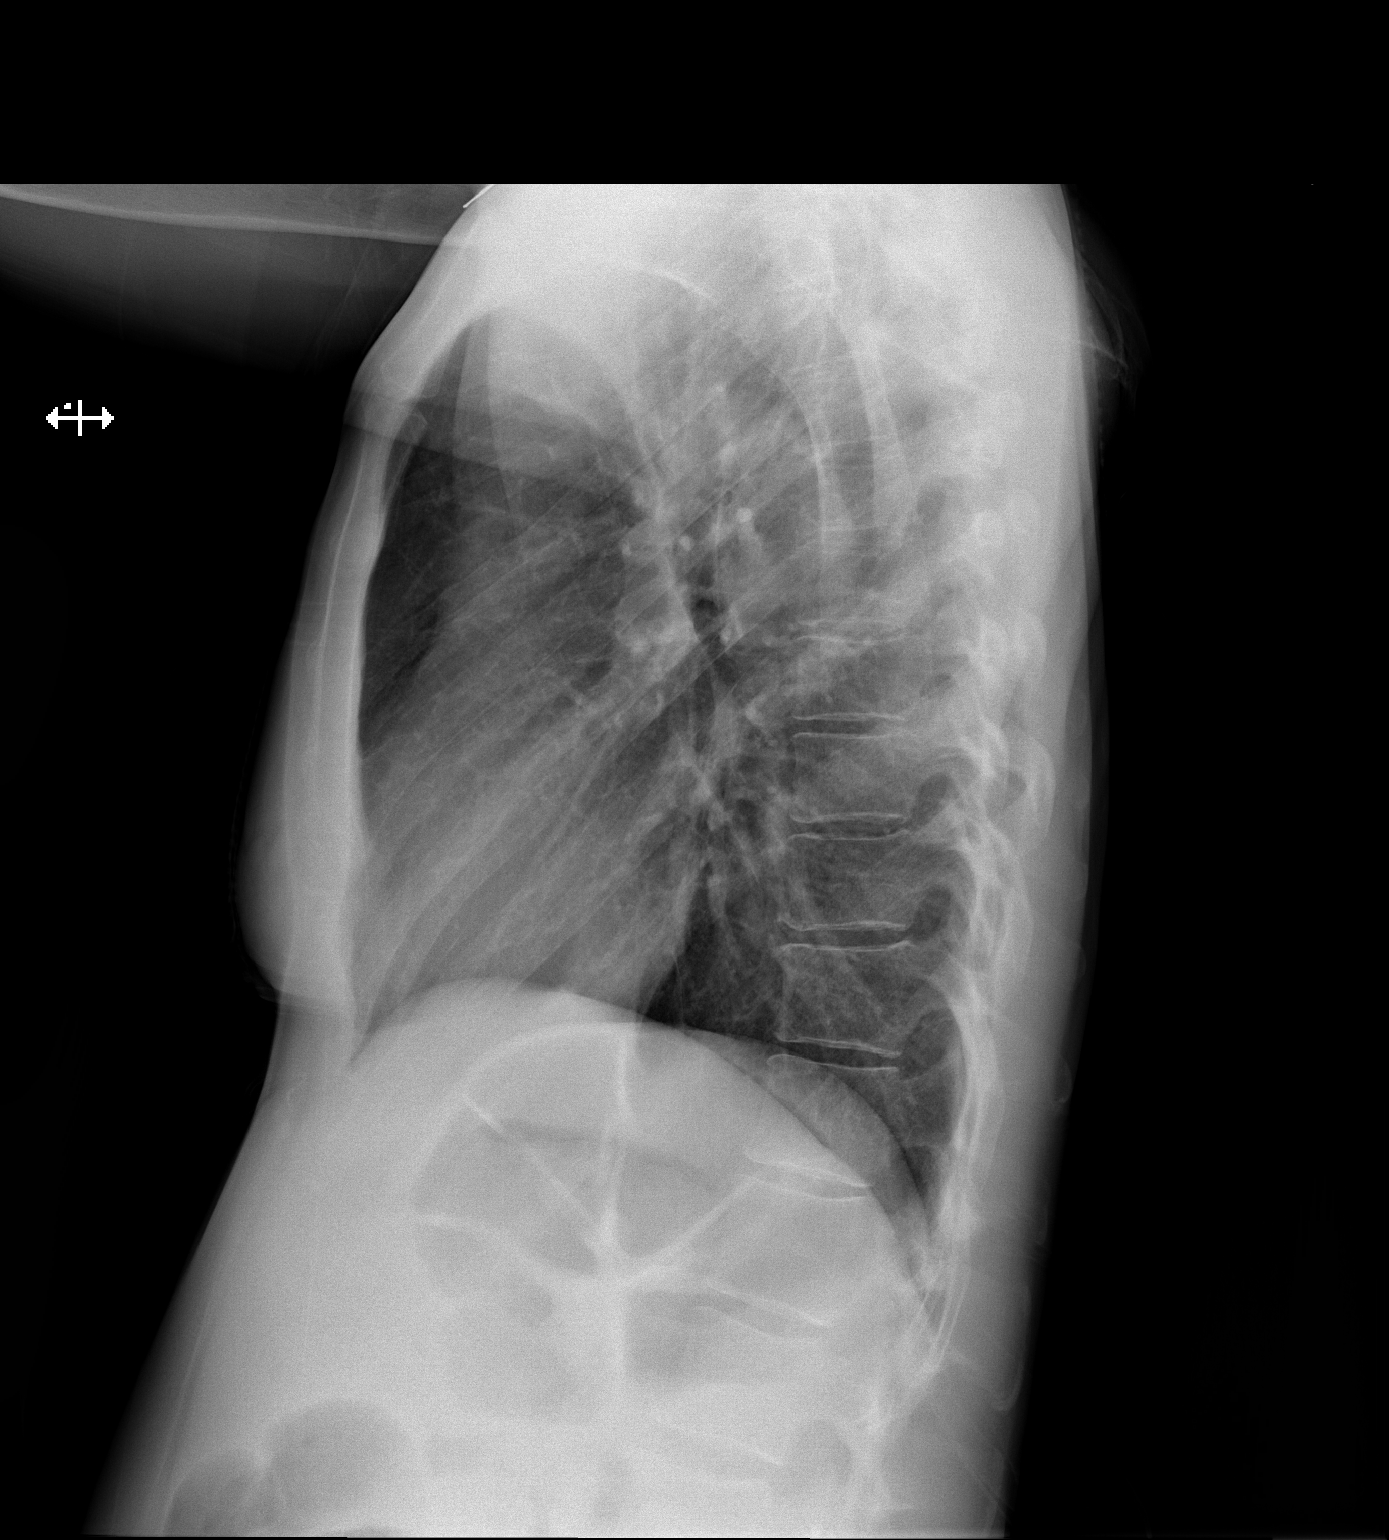

[2 of 2 positions shown; findings below may reference images not displayed]

FINDINGS: The heart size and mediastinal contours are within normal limits.
Both lungs are clear. The visualized skeletal structures are
unremarkable.
IMPRESSION: No active cardiopulmonary disease.
